# Patient Record
Sex: Male | Born: 1990 | Race: Black or African American | Hispanic: No | Marital: Single | Smoking: Current some day smoker
Health system: Southern US, Community
[De-identification: ages and names within clinical notes are randomized; demographics above are authoritative.]

## PROBLEM LIST (undated history)

## (undated) HISTORY — PX: ANKLE SURGERY: SHX546

## (undated) HISTORY — PX: FRACTURE SURGERY: SHX138

---

## 2013-01-20 ENCOUNTER — Emergency Department (HOSPITAL_BASED_OUTPATIENT_CLINIC_OR_DEPARTMENT_OTHER)
Admission: EM | Admit: 2013-01-20 | Discharge: 2013-01-20 | Disposition: A | Discharge status: Home / Self Care | Payer: BC Managed Care – PPO | Attending: Emergency Medicine | Admitting: Emergency Medicine

## 2013-01-20 ENCOUNTER — Emergency Department (HOSPITAL_BASED_OUTPATIENT_CLINIC_OR_DEPARTMENT_OTHER): Payer: BC Managed Care – PPO

## 2013-01-20 ENCOUNTER — Encounter (HOSPITAL_BASED_OUTPATIENT_CLINIC_OR_DEPARTMENT_OTHER): Payer: Self-pay | Admitting: *Deleted

## 2013-01-20 DIAGNOSIS — S82831A Other fracture of upper and lower end of right fibula, initial encounter for closed fracture: Secondary | ICD-10-CM

## 2013-01-20 DIAGNOSIS — Y92410 Unspecified street and highway as the place of occurrence of the external cause: Secondary | ICD-10-CM | POA: Insufficient documentation

## 2013-01-20 DIAGNOSIS — S82839A Other fracture of upper and lower end of unspecified fibula, initial encounter for closed fracture: Secondary | ICD-10-CM | POA: Insufficient documentation

## 2013-01-20 DIAGNOSIS — S93409A Sprain of unspecified ligament of unspecified ankle, initial encounter: Secondary | ICD-10-CM | POA: Insufficient documentation

## 2013-01-20 DIAGNOSIS — Y9389 Activity, other specified: Secondary | ICD-10-CM | POA: Insufficient documentation

## 2013-01-20 DIAGNOSIS — S93401A Sprain of unspecified ligament of right ankle, initial encounter: Secondary | ICD-10-CM

## 2013-01-20 MED ORDER — HYDROCODONE-ACETAMINOPHEN 5-325 MG PO TABS: 1.0000 | ORAL_TABLET | Freq: Four times a day (QID) | ORAL | Status: DC | PRN

## 2013-01-20 MED ORDER — HYDROCODONE-ACETAMINOPHEN 5-325 MG PO TABS
1.0000 | ORAL_TABLET | Freq: Once | ORAL | Status: AC
  Administered 2013-01-20: 1 via ORAL
  Filled 2013-01-20: qty 1
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
## 2013-01-20 NOTE — ED Notes (Signed)
EMT at bedside applying splint 

## 2013-01-20 NOTE — ED Notes (Signed)
Chris, PA-C at bedside 
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
## 2013-01-20 NOTE — ED Provider Notes (Signed)
Medical screening examination/treatment/procedure(s) were performed by non-physician practitioner and as supervising physician I was immediately available for consultation/collaboration.   Charles B. Bernette Mayers, MD 01/20/13 [phone]
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
## 2016-02-21 ENCOUNTER — Encounter (HOSPITAL_COMMUNITY): Payer: Self-pay | Admitting: Emergency Medicine

## 2016-02-21 ENCOUNTER — Ambulatory Visit (HOSPITAL_COMMUNITY)
Admission: EM | Admit: 2016-02-21 | Discharge: 2016-02-21 | Disposition: A | Discharge status: Home / Self Care | Payer: Self-pay | Attending: Family Medicine | Admitting: Family Medicine

## 2016-02-21 DIAGNOSIS — J40 Bronchitis, not specified as acute or chronic: Secondary | ICD-10-CM

## 2016-02-21 DIAGNOSIS — R634 Abnormal weight loss: Secondary | ICD-10-CM

## 2016-02-21 LAB — GLUCOSE, CAPILLARY: GLUCOSE-CAPILLARY: 93 mg/dL (ref 65–99)

## 2016-02-21 MED ORDER — AZITHROMYCIN 250 MG PO TABS: ORAL_TABLET | ORAL | Status: AC

## 2016-02-21 MED ORDER — ALBUTEROL SULFATE HFA 108 (90 BASE) MCG/ACT IN AERS: 1.0000 | INHALATION_SPRAY | Freq: Four times a day (QID) | RESPIRATORY_TRACT | Status: DC | PRN

## 2016-02-21 NOTE — Discharge Instructions (Signed)
Your blood sugar today is 93 which is normal. You need to arrange follow up with a primary MD for further evaluation of your unexplained weight loss.   How to Use an Inhaler Using your inhaler correctly is very important. Good technique will make sure that the medicine reaches your lungs.  HOW TO USE AN INHALER: 1. Take the cap off the inhaler. 2. If this is the first time using your inhaler, you need to prime it. Shake the inhaler for 5 seconds. Release four puffs into the air, away from your face. Ask your doctor for help if you have questions. 3. Shake the inhaler for 5 seconds. 4. Turn the inhaler so the bottle is above the mouthpiece. 5. Put your pointer finger on top of the bottle. Your thumb holds the bottom of the inhaler. 6. Open your mouth. 7. Either hold the inhaler away from your mouth (the width of 2 fingers) or place your lips tightly around the mouthpiece. Ask your doctor which way to use your inhaler. 8. Breathe out as much air as possible. 9. Breathe in and push down on the bottle 1 time to release the medicine. You will feel the medicine go in your mouth and throat. 10. Continue to take a deep breath in very slowly. Try to fill your lungs. 11. After you have breathed in completely, hold your breath for 10 seconds. This will help the medicine to settle in your lungs. If you cannot hold your breath for 10 seconds, hold it for as long as you can before you breathe out. 12. Breathe out slowly, through pursed lips. Whistling is an example of pursed lips. 13. If your doctor has told you to take more than 1 puff, wait at least 15-30 seconds between puffs. This will help you get the best results from your medicine. Do not use the inhaler more than your doctor tells you to. 14. Put the cap back on the inhaler. 15. Follow the directions from your doctor or from the inhaler package about cleaning the inhaler. If you use more than one inhaler, ask your doctor which inhalers to use and what  order to use them in. Ask your doctor to help you figure out when you will need to refill your inhaler.  If you use a steroid inhaler, always rinse your mouth with water after your last puff, gargle and spit out the water. Do not swallow the water. GET HELP IF:  The inhaler medicine only partially helps to stop wheezing or shortness of breath.  You are having trouble using your inhaler.  You have some increase in thick spit (phlegm). GET HELP RIGHT AWAY IF:  The inhaler medicine does not help your wheezing or shortness of breath or you have tightness in your chest.  You have dizziness, headaches, or fast heart rate.  You have chills, fever, or night sweats.  You have a large increase of thick spit, or your thick spit is bloody. MAKE SURE YOU:   Understand these instructions.  Will watch your condition.  Will get help right away if you are not doing well or get worse.   This information is not intended to replace advice given to you by your health care provider. Make sure you discuss any questions you have with your health care provider.   Document Released: 05/31/2008 Document Revised: 06/12/2013 Document Reviewed: 03/21/2013 Elsevier Interactive Patient Education [employer]2016 Elsevier Inc.

## 2016-02-21 NOTE — ED Notes (Signed)
PT reports a head cold and chest congestion for 1 week. PT is also concerned about weight loss. PT reports he has lost 25-30 lbs in six months. PT reports he is not trying to lose weight and has not made any big changes in diet or exercise. PT denies any issues with N/V/D.

## 2016-02-22 ENCOUNTER — Ambulatory Visit (INDEPENDENT_AMBULATORY_CARE_PROVIDER_SITE_OTHER): Payer: Self-pay | Admitting: Urgent Care

## 2016-02-22 VITALS — BP 128/64 | HR 64 | Temp 98.0°F | Resp 16 | Ht 69.0 in | Wt 150.2 lb

## 2016-02-22 DIAGNOSIS — Z0000 Encounter for general adult medical examination without abnormal findings: Secondary | ICD-10-CM

## 2016-02-22 DIAGNOSIS — Z91048 Other nonmedicinal substance allergy status: Secondary | ICD-10-CM

## 2016-02-22 DIAGNOSIS — Z23 Encounter for immunization: Secondary | ICD-10-CM

## 2016-02-22 DIAGNOSIS — R5383 Other fatigue: Secondary | ICD-10-CM

## 2016-02-22 DIAGNOSIS — R634 Abnormal weight loss: Secondary | ICD-10-CM

## 2016-02-22 DIAGNOSIS — Z9109 Other allergy status, other than to drugs and biological substances: Secondary | ICD-10-CM

## 2016-02-22 DIAGNOSIS — R5381 Other malaise: Secondary | ICD-10-CM

## 2016-02-22 DIAGNOSIS — D649 Anemia, unspecified: Secondary | ICD-10-CM

## 2016-02-22 LAB — POCT CBC
GRANULOCYTE PERCENT: 44.6 % (ref 37–80)
HCT, POC: 39.7 % — AB (ref 43.5–53.7)
HEMOGLOBIN: 13.8 g/dL — AB (ref 14.1–18.1)
LYMPH, POC: 2.7 (ref 0.6–3.4)
MCH, POC: 29.4 pg (ref 27–31.2)
MCHC: 34.7 g/dL (ref 31.8–35.4)
MCV: 84.8 fL (ref 80–97)
MID (cbc): 0.4 (ref 0–0.9)
MPV: 8.5 fL (ref 0–99.8)
PLATELET COUNT, POC: 161 10*3/uL (ref 142–424)
POC GRANULOCYTE: 2.5 (ref 2–6.9)
POC LYMPH PERCENT: 48.2 %L (ref 10–50)
POC MID %: 7.2 % (ref 0–12)
RBC: 4.68 M/uL — AB (ref 4.69–6.13)
RDW, POC: 13.8 %
WBC: 5.7 10*3/uL (ref 4.6–10.2)

## 2016-02-22 LAB — COMPREHENSIVE METABOLIC PANEL
ALBUMIN: 4.2 g/dL (ref 3.6–5.1)
ALK PHOS: 58 U/L (ref 40–115)
ALT: 20 U/L (ref 9–46)
AST: 21 U/L (ref 10–40)
BILIRUBIN TOTAL: 0.7 mg/dL (ref 0.2–1.2)
BUN: 11 mg/dL (ref 7–25)
CHLORIDE: 103 mmol/L (ref 98–110)
CO2: 31 mmol/L (ref 20–31)
CREATININE: 1.26 mg/dL (ref 0.60–1.35)
Calcium: 9.1 mg/dL (ref 8.6–10.3)
Glucose, Bld: 69 mg/dL (ref 65–99)
Potassium: 4.9 mmol/L (ref 3.5–5.3)
SODIUM: 142 mmol/L (ref 135–146)
TOTAL PROTEIN: 7 g/dL (ref 6.1–8.1)

## 2016-02-22 LAB — POCT GLYCOSYLATED HEMOGLOBIN (HGB A1C): HEMOGLOBIN A1C: 5.6

## 2016-02-22 LAB — POCT URINALYSIS DIP (MANUAL ENTRY)
BILIRUBIN UA: NEGATIVE
BILIRUBIN UA: NEGATIVE
Glucose, UA: NEGATIVE
Leukocytes, UA: NEGATIVE
NITRITE UA: NEGATIVE
PH UA: 7.5
Protein Ur, POC: NEGATIVE
RBC UA: NEGATIVE
SPEC GRAV UA: 1.015
Urobilinogen, UA: 0.2

## 2016-02-22 LAB — FERRITIN: FERRITIN: 104 ng/mL (ref 20–345)

## 2016-02-22 LAB — TSH: TSH: 1.17 m[IU]/L (ref 0.40–4.50)

## 2016-02-22 MED ORDER — FLUTICASONE PROPIONATE 50 MCG/ACT NA SUSP: 2.0000 | Freq: Every day | NASAL | Status: AC

## 2016-02-22 MED ORDER — CETIRIZINE HCL 10 MG PO TABS: 10.0000 mg | ORAL_TABLET | Freq: Every day | ORAL | Status: AC

## 2016-02-22 NOTE — Progress Notes (Signed)
MRN: [id number]  Subjective:   [name] is a 25 y.o. male presenting for annual physical exam.  Medical care team includes: PCP: No PCP Per Patient Vision: Gets eye exams yearly. Wears contacts and glasses. Dental: None, no insurance. Specialists: None.  Patient is here for an annual physical. Lives with a significant other, plans on getting an STI screen at the HD.  Weight loss - Reports 6 months history of non-intentional weight loss of ~30 lbs. Associated with intermittent decreased appetite, fatigue and malaise. Eats out a lot, fast food. Drinks a lot of sweet tea. Just started to drink water. Does not exercise. Works at [employer]. Denies fever, n/v, abdominal pain, diarrhea, loose stools, bloody stools.   Congestion - Reports 6 months history of nasal congestion, post-nasal drip, sinus pressure. Otherwise ROS reported above or below. Of note, [facility], started on Azithromycin for bronchitis. He did not fill script for albuterol. Denies history of seasonal allergies, has not used any medications for relief.   Clifford Cunningham has a current medication list which includes the following prescription(s): azithromycin. He has No Known Allergies.  Clifford Cunningham  has no past medical history on file. Also  has past surgical history that includes Ankle surgery and Fracture surgery.  Denies family history of cancer, diabetes, HTN, HL, heart disease, stroke, mental illness.   Immunizations: TDAP needs to be updated.  Review of Systems  Constitutional: Positive for weight loss. Negative for fever, chills, malaise/fatigue and diaphoresis.  HENT: Positive for congestion. Negative for ear discharge, ear pain, hearing loss, nosebleeds, sore throat and tinnitus.   Eyes: Negative for blurred vision, double vision, photophobia, pain, discharge and redness.  Respiratory: Negative for cough, shortness of breath and wheezing.   Cardiovascular: Negative for  chest pain, palpitations and leg swelling.  Gastrointestinal: Negative for nausea, vomiting, abdominal pain, diarrhea, constipation and blood in stool.  Genitourinary: Negative for dysuria, urgency, frequency, hematuria and flank pain.  Musculoskeletal: Negative for myalgias, back pain and joint pain.  Skin: Negative for itching and rash.  Neurological: Negative for dizziness, tingling, seizures, loss of consciousness, weakness and headaches.  Endo/Heme/Allergies: Negative for polydipsia.  Psychiatric/Behavioral: Negative for depression, suicidal ideas, hallucinations, memory loss and substance abuse. The patient is not nervous/anxious and does not have insomnia.    Objective:   Vitals: BP 128/64 mmHg  Pulse 64  Temp(Src) 98 F (36.7 C) (Oral)  Resp 16  Ht 5\' 9"  (1.753 m)  Wt 150 lb 3.2 oz (68.13 kg)  BMI 22.17 kg/m2  Wt Readings from Last 3 Encounters:  02/22/16 150 lb 3.2 oz (68.13 kg)  01/20/13 180 lb (81.647 kg)    Physical Exam  Constitutional: He is oriented to person, place, and time. He appears well-developed and well-nourished.  HENT:  TM's intact bilaterally, no effusions or erythema. Nasal turbinates pink and moist, nasal passages patent. No sinus tenderness. Oropharynx clear, mucous membranes moist, dentition in good repair.  Eyes: Conjunctivae and EOM are normal. Pupils are equal, round, and reactive to light. Right eye exhibits no discharge. Left eye exhibits no discharge. No scleral icterus.  Neck: Normal range of motion. Neck supple. No thyromegaly present.  Cardiovascular: Normal rate, regular rhythm and intact distal pulses.  Exam reveals no gallop and no friction rub.   No murmur heard. Pulmonary/Chest: No stridor. No respiratory distress. He has no wheezes. He has no rales.  Abdominal: Soft. Bowel sounds are normal. He exhibits no distension and  no mass. There is no tenderness.  Musculoskeletal: Normal range of motion. He exhibits no edema or tenderness.    Lymphadenopathy:    He has no cervical adenopathy.  Neurological: He is alert and oriented to person, place, and time. He has normal reflexes.  Skin: Skin is warm and dry. No rash noted. No erythema. No pallor.  Psychiatric: He has a normal mood and affect.   Results for orders placed or performed in visit on 02/22/16 (from the past 24 hour(s))  POCT glycosylated hemoglobin (Hb A1C)     Status: None   Collection Time: 02/22/16  3:07 PM  Result Value Ref Range   Hemoglobin A1C 5.6   POCT urinalysis dipstick     Status: None   Collection Time: 02/22/16  3:24 PM  Result Value Ref Range   Color, UA yellow yellow   Clarity, UA clear clear   Glucose, UA negative negative   Bilirubin, UA negative negative   Ketones, POC UA negative negative   Spec Grav, UA 1.015    Blood, UA negative negative   pH, UA 7.5    Protein Ur, POC negative negative   Urobilinogen, UA 0.2    Nitrite, UA Negative Negative   Leukocytes, UA Negative Negative  POCT CBC     Status: Abnormal   Collection Time: 02/22/16  3:29 PM  Result Value Ref Range   WBC 5.7 4.6 - 10.2 K/uL   Lymph, poc 2.7 0.6 - 3.4   POC LYMPH PERCENT 48.2 10 - 50 %L   MID (cbc) 0.4 0 - 0.9   POC MID % 7.2 0 - 12 %M   POC Granulocyte 2.5 2 - 6.9   Granulocyte percent 44.6 37 - 80 %G   RBC 4.68 (A) 4.69 - 6.13 M/uL   Hemoglobin 13.8 (A) 14.1 - 18.1 g/dL   HCT, POC 16.1 (A) 09.6 - 53.7 %   MCV 84.8 80 - 97 fL   MCH, POC 29.4 27 - 31.2 pg   MCHC 34.7 31.8 - 35.4 g/dL   RDW, POC 04.5 %   Platelet Count, POC 161 142 - 424 K/uL   MPV 8.5 0 - 99.8 fL   Assessment and Plan :   1. Annual physical exam - Pleasant young man. Stable, labs pending. Discussed healthy lifestyle, diet, exercise, preventative care, vaccinations, and addressed patient's concerns.    2. Loss of weight 3. Malaise and fatigue - Unclear etiology. Patient insists on getting STI testing through the health department due to cost burden.  4. Environmental allergies -  Start Zyrtec, Flonase.   5. Anemia, unspecified - Labs pending, may repeat in the near future.  6. Need for Tdap vaccination - Tdap vaccine greater than or equal to 7yo IM   Wallis Bamberg, PA-C [facility] [phone] 02/22/2016  2:48 PM

## 2016-02-22 NOTE — Progress Notes (Deleted)
    MRN: [id number]  Subjective:   [name] is a 25 y.o. male presenting for annual physical exam and ***.  Medical care team includes: PCP: No PCP Per Patient Vision: *** Dental: *** Specialists:    *** Concerns? Social? Diet? Exercise?  Clifford Cunningham  does not have a problem list on file.  Clifford Cunningham has a current medication list which includes the following prescription(s): azithromycin and albuterol. He has No Known Allergies.  Clifford Cunningham  has no past medical history on file. Also  has past surgical history that includes Ankle surgery and Fracture surgery.  family history is not on file.  Immunizations: Flu ***, last TDAP ***  ROS   Objective:   Vitals: BP 128/64 mmHg  Pulse 64  Temp(Src) 98 F (36.7 C) (Oral)  Resp 16  Ht 5\' 9"  (1.753 m)  Wt 150 lb 3.2 oz (68.13 kg)  BMI 22.17 kg/m2  Physical Exam  Results for orders placed or performed during the hospital encounter of 02/21/16 (from the past 24 hour(s))  Glucose, capillary     Status: None   Collection Time: 02/21/16  6:55 PM  Result Value Ref Range   Glucose-Capillary 93 65 - 99 mg/dL    Assessment and Plan :     Wallis BambergMario Mattia Liford, PA-C [facility] [phone] 02/22/2016  2:40 PM

## 2016-02-22 NOTE — Patient Instructions (Addendum)

## 2016-02-22 NOTE — Progress Notes (Deleted)
   Subjective:    Patient ID: Clifford DrownJonathan Phang, male    DOB: [birth year], 25 y.o.   MRN: [id number]  HPI    Review of Systems  Constitutional: Positive for appetite change and fatigue.  HENT: Positive for rhinorrhea and sinus pressure.   Eyes: Negative.   Respiratory: Negative.   Cardiovascular: Negative.   Gastrointestinal: Negative.   Endocrine: Negative.   Genitourinary: Negative.   Musculoskeletal: Negative.   Skin: Negative.   Allergic/Immunologic: Negative.   Neurological: Negative.   Hematological: Negative.   Psychiatric/Behavioral: Negative.        Objective:   Physical Exam        Assessment & Plan:

## 2016-02-23 LAB — PATHOLOGIST SMEAR REVIEW

## 2016-02-25 ENCOUNTER — Encounter: Payer: Self-pay | Admitting: Urgent Care

## 2016-02-25 NOTE — ED Provider Notes (Signed)
CSN: [id number]     Arrival date & time 02/21/16  1721 History   First MD Initiated Contact with Patient 02/21/16 1842     Chief Complaint  Patient presents with  . URI  . Weight Loss    Patient is a 25 y.o. male presenting with URI. The history is provided by the patient.  URI Presenting symptoms: congestion, cough, fatigue and fever   Presenting symptoms: no ear pain, no facial pain, no rhinorrhea and no sore throat   Severity:  Moderate Onset quality:  Gradual Duration:  2 weeks Timing:  Intermittent Progression:  Unchanged Chronicity:  New Relieved by:  Nothing Worsened by:  Nothing tried Ineffective treatments:  OTC medications Associated symptoms: wheezing   Associated symptoms: no arthralgias, no headaches, no myalgias, no neck pain, no sinus pain, no sneezing and no swollen glands   Risk factors: recent illness   Risk factors: not elderly, no chronic cardiac disease, no chronic kidney disease, no chronic respiratory disease, no diabetes mellitus, no immunosuppression, no recent travel and no sick contacts   Pt reports 2 week h/o persistent cough and subjective intermittent fever. Cough is productive at times of thick yellow sputum. Admit to brief cold like symptoms prior to onset of symptoms in question. Symptoms have been refravtory to OTC remedies.  Weight loss ro exercise.  Pt also reports concern regarding 25-30 lb weight loss over last 6 months. States he has made no active effort to loose weight, no changes in diet or exercise. No other associated symptoms except current persistent cough.  History reviewed. No pertinent past medical history. Past Surgical History  Procedure Laterality Date  . Ankle surgery    . Fracture surgery     No family history on file. Social History  Substance Use Topics  . Smoking status: Current Some Day Smoker  . Smokeless tobacco: None  . Alcohol Use: 0.0 oz/week    0 Standard drinks or equivalent per week     Comment: ocas     Review of Systems  Constitutional: Positive for fever, fatigue and unexpected weight change.  HENT: Positive for congestion. Negative for ear discharge, ear pain, rhinorrhea, sneezing and sore throat.   Eyes: Negative.   Respiratory: Positive for cough and wheezing. Negative for shortness of breath and stridor.   Cardiovascular: Negative.   Gastrointestinal: Negative.   Genitourinary: Negative.   Musculoskeletal: Negative.  Negative for myalgias, arthralgias and neck pain.  Skin: Negative.   Allergic/Immunologic: Positive for immunocompromised state.  Neurological: Negative.  Negative for headaches.  Hematological: Negative.   Psychiatric/Behavioral: Negative.     Allergies  Review of patient's allergies indicates no known allergies.  Home Medications   Prior to Admission medications   Medication Sig Start Date End Date Taking? Authorizing Provider  azithromycin (ZITHROMAX) 250 MG tablet Take 2 tabs on day 1 and then 1 tab qd on days 2-5. 02/21/16   Leanne ChangKatherine P Niajah Sipos, NP  cetirizine (ZYRTEC) 10 MG tablet Take 1 tablet (10 mg total) by mouth daily. 02/22/16   Wallis BambergMario Mani, PA-C  fluticasone [facility](FLONASE) 50 MCG/ACT nasal spray Place 2 sprays into both nostrils daily. 02/22/16   Wallis BambergMario Mani, PA-C   Meds Ordered and Administered this Visit  Medications - No data to display  BP 114/66 mmHg  Pulse 79  Temp(Src) 98.4 F (36.9 C) (Oral)  Resp 18  SpO2 100% No data found.   Physical Exam  Constitutional: He is oriented to person, place, and time. He appears well-developed and  well-nourished.  HENT:  Head: Normocephalic and atraumatic.  Eyes: Conjunctivae are normal.  Cardiovascular: Normal rate and regular rhythm.   Pulmonary/Chest: Effort normal.  Neurological: He is alert and oriented to person, place, and time.  Skin: Skin is warm and dry.  Psychiatric: He has a normal mood and affect.    ED Course  Procedures (including critical care time)  Labs Review Labs Reviewed   GLUCOSE, CAPILLARY    Imaging Review No results found.   Visual Acuity Review  Right Eye Distance:   Left Eye Distance:   Bilateral Distance:    Right Eye Near:   Left Eye Near:    Bilateral Near:         MDM   1. Bronchitis : Rx: Z-Pack and Albuterol inhaler  2. Weight loss: Pt encouraged to establish w/ PCP as issue w/ wt requires comprehensive work-up not appropriate for this setting. Parents present w/ pt and all verbalize understanding and agree with plan.      Leanne ChangKatherine P Bing Duffey, NP 02/25/16 [phone]
